# Patient Record
Sex: Male | Born: 1935 | Race: White | Marital: Married | Smoking: Former smoker
Health system: Southern US, Community
[De-identification: ages and names within clinical notes are randomized; demographics above are authoritative.]

## PROBLEM LIST (undated history)

## (undated) DIAGNOSIS — I714 Abdominal aortic aneurysm, without rupture, unspecified: Secondary | ICD-10-CM

---

## 2015-06-05 ENCOUNTER — Other Ambulatory Visit: Payer: Self-pay | Admitting: Geriatric Medicine

## 2015-06-05 DIAGNOSIS — R1900 Intra-abdominal and pelvic swelling, mass and lump, unspecified site: Secondary | ICD-10-CM

## 2015-06-20 ENCOUNTER — Ambulatory Visit
Admission: RE | Admit: 2015-06-20 | Discharge: 2015-06-20 | Disposition: A | Discharge status: Home / Self Care | Payer: Medicare Other | Source: Ambulatory Visit | Attending: Geriatric Medicine | Admitting: Geriatric Medicine

## 2015-06-20 DIAGNOSIS — R1900 Intra-abdominal and pelvic swelling, mass and lump, unspecified site: Secondary | ICD-10-CM

## 2015-06-20 MED ORDER — IOPAMIDOL (ISOVUE-300) INJECTION 61%
100.0000 mL | Freq: Once | INTRAVENOUS | Status: AC | PRN
  Administered 2015-06-20: 100 mL via INTRAVENOUS

## 2016-03-04 ENCOUNTER — Emergency Department: Payer: Medicare Other

## 2016-03-04 ENCOUNTER — Inpatient Hospital Stay
Admission: EM | Admit: 2016-03-04 | Discharge: 2016-03-06 | DRG: 871 | Disposition: A | Discharge status: Hospice | Payer: Medicare Other | Attending: Internal Medicine | Admitting: Internal Medicine

## 2016-03-04 DIAGNOSIS — Z87891 Personal history of nicotine dependence: Secondary | ICD-10-CM

## 2016-03-04 DIAGNOSIS — I959 Hypotension, unspecified: Secondary | ICD-10-CM | POA: Diagnosis present

## 2016-03-04 DIAGNOSIS — A419 Sepsis, unspecified organism: Principal | ICD-10-CM | POA: Diagnosis present

## 2016-03-04 DIAGNOSIS — F0390 Unspecified dementia without behavioral disturbance: Secondary | ICD-10-CM | POA: Diagnosis present

## 2016-03-04 DIAGNOSIS — R627 Adult failure to thrive: Secondary | ICD-10-CM | POA: Diagnosis present

## 2016-03-04 DIAGNOSIS — R778 Other specified abnormalities of plasma proteins: Secondary | ICD-10-CM | POA: Diagnosis present

## 2016-03-04 DIAGNOSIS — K668 Other specified disorders of peritoneum: Secondary | ICD-10-CM

## 2016-03-04 DIAGNOSIS — N179 Acute kidney failure, unspecified: Secondary | ICD-10-CM | POA: Diagnosis present

## 2016-03-04 DIAGNOSIS — G9340 Encephalopathy, unspecified: Secondary | ICD-10-CM | POA: Diagnosis present

## 2016-03-04 DIAGNOSIS — N390 Urinary tract infection, site not specified: Secondary | ICD-10-CM | POA: Diagnosis present

## 2016-03-04 DIAGNOSIS — Z789 Other specified health status: Secondary | ICD-10-CM | POA: Diagnosis not present

## 2016-03-04 DIAGNOSIS — K631 Perforation of intestine (nontraumatic): Secondary | ICD-10-CM | POA: Diagnosis present

## 2016-03-04 DIAGNOSIS — R1084 Generalized abdominal pain: Secondary | ICD-10-CM | POA: Diagnosis not present

## 2016-03-04 DIAGNOSIS — Z66 Do not resuscitate: Secondary | ICD-10-CM

## 2016-03-04 DIAGNOSIS — R6521 Severe sepsis with septic shock: Secondary | ICD-10-CM

## 2016-03-04 DIAGNOSIS — E43 Unspecified severe protein-calorie malnutrition: Secondary | ICD-10-CM | POA: Diagnosis present

## 2016-03-04 DIAGNOSIS — I714 Abdominal aortic aneurysm, without rupture: Secondary | ICD-10-CM | POA: Diagnosis present

## 2016-03-04 DIAGNOSIS — Z515 Encounter for palliative care: Secondary | ICD-10-CM | POA: Diagnosis present

## 2016-03-04 HISTORY — DX: Abdominal aortic aneurysm, without rupture: I71.4

## 2016-03-04 HISTORY — DX: Abdominal aortic aneurysm, without rupture, unspecified: I71.40

## 2016-03-04 LAB — COMPREHENSIVE METABOLIC PANEL
ALT: 32 U/L (ref 17–63)
ANION GAP: 9 (ref 5–15)
AST: 94 U/L — AB (ref 15–41)
Albumin: 2.5 g/dL — ABNORMAL LOW (ref 3.5–5.0)
Alkaline Phosphatase: 79 U/L (ref 38–126)
BUN: 47 mg/dL — ABNORMAL HIGH (ref 6–20)
CALCIUM: 9.2 mg/dL (ref 8.9–10.3)
CHLORIDE: 100 mmol/L — AB (ref 101–111)
CO2: 26 mmol/L (ref 22–32)
Creatinine, Ser: 2.15 mg/dL — ABNORMAL HIGH (ref 0.61–1.24)
GFR, EST AFRICAN AMERICAN: 30 mL/min — AB (ref >60)
GFR, EST NON AFRICAN AMERICAN: 26 mL/min — AB (ref >60)
Glucose, Bld: 104 mg/dL — ABNORMAL HIGH (ref 65–99)
POTASSIUM: 4.4 mmol/L (ref 3.5–5.1)
Sodium: 135 mmol/L (ref 135–145)
TOTAL PROTEIN: 6.7 g/dL (ref 6.5–8.1)
Total Bilirubin: 0.7 mg/dL (ref 0.3–1.2)

## 2016-03-04 LAB — CBC WITH DIFFERENTIAL/PLATELET
BASOS ABS: 0 10*3/uL (ref 0–0.1)
BASOS PCT: 0 %
EOS ABS: 0 10*3/uL (ref 0–0.7)
EOS PCT: 0 %
HEMATOCRIT: 34.3 % — AB (ref 40.0–52.0)
Hemoglobin: 11.6 g/dL — ABNORMAL LOW (ref 13.0–18.0)
Lymphocytes Relative: 6 %
Lymphs Abs: 1.1 10*3/uL (ref 1.0–3.6)
MCH: 27.9 pg (ref 26.0–34.0)
MCHC: 33.7 g/dL (ref 32.0–36.0)
MCV: 82.8 fL (ref 80.0–100.0)
MONO ABS: 0.2 10*3/uL (ref 0.2–1.0)
MONOS PCT: 1 %
Neutro Abs: 15.5 10*3/uL — ABNORMAL HIGH (ref 1.4–6.5)
Neutrophils Relative %: 93 %
PLATELETS: 426 10*3/uL (ref 150–440)
RBC: 4.15 MIL/uL — ABNORMAL LOW (ref 4.40–5.90)
RDW: 14.5 % (ref 11.5–14.5)
WBC: 16.8 10*3/uL — ABNORMAL HIGH (ref 3.8–10.6)

## 2016-03-04 LAB — URINALYSIS COMPLETE WITH MICROSCOPIC (ARMC ONLY)
Bilirubin Urine: NEGATIVE
Glucose, UA: NEGATIVE mg/dL
Nitrite: NEGATIVE
PH: 7 (ref 5.0–8.0)
SPECIFIC GRAVITY, URINE: 1.013 (ref 1.005–1.030)

## 2016-03-04 LAB — TROPONIN I: TROPONIN I: 0.08 ng/mL — AB (ref <0.03)

## 2016-03-04 LAB — LACTIC ACID, PLASMA: LACTIC ACID, VENOUS: 1.6 mmol/L (ref 0.5–1.9)

## 2016-03-04 MED ORDER — SODIUM CHLORIDE 0.9 % IV BOLUS (SEPSIS)
500.0000 mL | Freq: Once | INTRAVENOUS | Status: AC
  Administered 2016-03-04: 500 mL via INTRAVENOUS

## 2016-03-04 MED ORDER — PIPERACILLIN-TAZOBACTAM 3.375 G IVPB 30 MIN
3.3750 g | Freq: Once | INTRAVENOUS | Status: AC
  Administered 2016-03-04: 3.375 g via INTRAVENOUS

## 2016-03-04 MED ORDER — ACETAMINOPHEN 650 MG RE SUPP: 650.0000 mg | Freq: Four times a day (QID) | RECTAL | Status: DC | PRN

## 2016-03-04 MED ORDER — NOREPINEPHRINE BITARTRATE 1 MG/ML IV SOLN
0.0000 ug/min | Freq: Once | INTRAVENOUS | Status: DC
  Administered 2016-03-04: 5 ug/min via INTRAVENOUS

## 2016-03-04 MED ORDER — SODIUM CHLORIDE 0.9 % IV BOLUS (SEPSIS)
1000.0000 mL | Freq: Once | INTRAVENOUS | Status: AC
  Administered 2016-03-04: 1000 mL via INTRAVENOUS

## 2016-03-04 MED ORDER — NOREPINEPHRINE 4 MG/250ML-% IV SOLN
0.0000 ug/min | INTRAVENOUS | Status: DC
  Administered 2016-03-04: 8 ug/min via INTRAVENOUS

## 2016-03-04 MED ORDER — VANCOMYCIN HCL IN DEXTROSE 1-5 GM/200ML-% IV SOLN
1000.0000 mg | Freq: Once | INTRAVENOUS | Status: AC
  Administered 2016-03-04: 1000 mg via INTRAVENOUS

## 2016-03-04 MED ORDER — ACETAMINOPHEN 325 MG PO TABS: 650.0000 mg | ORAL_TABLET | Freq: Four times a day (QID) | ORAL | Status: DC | PRN

## 2016-03-04 MED ORDER — MORPHINE SULFATE (PF) 2 MG/ML IV SOLN
1.0000 mg | INTRAVENOUS | Status: DC | PRN
  Administered 2016-03-05: 1 mg via INTRAVENOUS
  Filled 2016-03-04 (×2): qty 1

## 2016-03-04 MED ORDER — LORAZEPAM 2 MG/ML IJ SOLN
0.5000 mg | Freq: Four times a day (QID) | INTRAMUSCULAR | Status: DC | PRN
  Administered 2016-03-06: 10:00:00 0.5 mg via INTRAVENOUS
  Filled 2016-03-04: qty 1

## 2016-03-04 NOTE — ED Notes (Signed)
CODE SEPSIS CALLED TO DOUG AT [facility] 

## 2016-03-04 NOTE — ED Provider Notes (Addendum)
[facility] Emergency Department Provider Note  ____________________________________________  Time seen: Approximately 1:45 PM  I have reviewed the triage vital signs and the nursing notes.   HISTORY  Chief Complaint Failure To Thrive  Level 5 caveat:  Portions of the history and physical were unable to be obtained due to AMS   HPI Travis Jordan is a 82 y.o. male with h/o AAA and dementia who presents for evaluation of AMS. History is gathered mostly from patient's daughter. She reports the patient has been diagnosed with failure to thrive for over a year due to his dementia. However for the last 3 days patient has been sleeping mostly. Not eating or drinking, last contracted. No fever, no nausea, no vomiting, no trauma, no diarrhea. Patient is unable to provide any history at this time. He arouses to sternal rub and open his eyes however does not answer any questions.  No past medical history on file.  There are no active problems to display for this patient.   No past surgical history on file.  Prior to Admission medications   Not on File    Allergies Review of patient's allergies indicates not on file.  No family history on file.  Social History Social History  Substance Use Topics  . Smoking status: Not on file  . Smokeless tobacco: Not on file  . Alcohol use Not on file    Review of Systems Unable to obtain ____________________________________________   PHYSICAL EXAM:  VITAL SIGNS: ED Triage Vitals  Enc Vitals Group     BP 03/04/16 1339 (!) 80/63     Pulse Rate 03/04/16 1339 100     Resp 03/04/16 1339 (!) 24     Temp 03/04/16 1339 98 F (36.7 C)     Temp Source 03/04/16 1339 Oral     SpO2 03/04/16 1339 92 %     Weight 03/04/16 1341 110 lb (49.9 kg)     Height 03/04/16 1341 5\' 11"  (1.803 m)     Head Circumference --      Peak Flow --      Pain Score --      Pain Loc --      Pain Edu? --      Excl. in GC? --      Constitutional: Arouses to sternum rub, disoriented, no distress. HEENT:      Head: Normocephalic and atraumatic.         Eyes: Conjunctivae are normal. Sclera is non-icteric. EOMI. PERRL      Mouth/Throat: Mucous membranes are dry.       Neck: Supple with no signs of meningismus. Cardiovascular: Tachycardic with regular rhythm. No murmurs, gallops, or rubs. 2+ symmetrical distal pulses are present in all extremities. No JVD. Respiratory: Hypoxic to low 90s on RA, distant lungs sounds. Tachypneic to the mid 20s Gastrointestinal: Soft, diffusely tender to palpation, non distended with positive bowel sounds. No rebound or guarding. Genitourinary: No CVA tenderness. Musculoskeletal: Nontender with normal range of motion in all extremities. No edema, cyanosis, or erythema of extremities. Neurologic: Face is symmetric, moves all 4 extremities, disoriented, arouses to voice and opens eyes.  Skin: Skin is warm, dry and intact. No rash noted.   ____________________________________________   LABS (all labs ordered are listed, but only abnormal results are displayed)  Labs Reviewed  URINALYSIS COMPLETEWITH MICROSCOPIC ([facility] ONLY) - Abnormal; Notable for the following:       Result Value   Color, Urine RED (*)  APPearance TURBID (*)    Ketones, ur TRACE (*)    Hgb urine dipstick 1+ (*)    Protein, ur >500 (*)    Leukocytes, UA 2+ (*)    Bacteria, UA MANY (*)    Squamous Epithelial / LPF 0-5 (*)    All other components within normal limits  COMPREHENSIVE METABOLIC PANEL - Abnormal; Notable for the following:    Chloride 100 (*)    Glucose, Bld 104 (*)    BUN 47 (*)    Creatinine, Ser 2.15 (*)    Albumin 2.5 (*)    AST 94 (*)    GFR calc non Af Amer 26 (*)    GFR calc Af Amer 30 (*)    All other components within normal limits  CBC WITH DIFFERENTIAL/PLATELET - Abnormal; Notable for the following:    WBC 16.8 (*)    RBC 4.15 (*)    Hemoglobin 11.6 (*)    HCT 34.3 (*)     Neutro Abs 15.5 (*)    All other components within normal limits  TROPONIN I - Abnormal; Notable for the following:    Troponin I 0.08 (*)    All other components within normal limits  URINE CULTURE  CULTURE, BLOOD (ROUTINE X 2)  CULTURE, BLOOD (ROUTINE X 2)  LACTIC ACID, PLASMA  LACTIC ACID, PLASMA  CBG MONITORING, ED   ____________________________________________  EKG  ED ECG REPORT I, Nita Sicklearolina Latonyia Lopata, the attending physician, personally viewed and interpreted this ECG.  Normal sinus rhythm, rate of 82, normal intervals, normal axis, no ST elevations or depressions. ____________________________________________  RADIOLOGY  CXR:  Negative  KUB: Pneumoperitoneum.  RIGHT lung base atelectasis.  Moderate atherosclerosis. ____________________________________________   PROCEDURES  Procedure(s) performed:yes .Central Line Date/Time: 03/04/2016 3:39 PM Performed by: Nita SickleVERONESE, [employer] Authorized by: Nita SickleVERONESE, [place]   Consent:    Consent obtained:  Written   Consent given by:  Spouse and guardian   Risks discussed:  Arterial puncture, incorrect placement, bleeding, infection and nerve damage   Alternatives discussed:  No treatment Pre-procedure details:    Hand hygiene: Hand hygiene performed prior to insertion     Sterile barrier technique: All elements of maximal sterile technique followed     Skin preparation:  2% chlorhexidine   Skin preparation agent: Skin preparation agent completely dried prior to procedure   Anesthesia (see MAR for exact dosages):    Anesthesia method:  Local infiltration   Local anesthetic:  Lidocaine 1% w/o epi Procedure details:    Location:  L internal jugular   Patient position:  Trendelenburg   Procedural supplies:  Triple lumen   Landmarks identified: yes     Ultrasound guidance: yes     Sterile ultrasound techniques: Sterile gel and sterile probe covers were used     Number of attempts:  1   Successful placement: yes    Post-procedure details:    Post-procedure:  Dressing applied and line sutured   Assessment:  Blood return through all ports, no pneumothorax on x-ray, free fluid flow and placement verified by x-ray   Patient tolerance of procedure:  Tolerated well, no immediate complications      Critical Care performed: CRITICAL CARE Performed by: Nita Sicklearolina Danaja Lasota  ?  Total critical care time: 60 min  Critical care time was exclusive of separately billable procedures and treating other patients.  Critical care was necessary to treat or prevent imminent or life-threatening deterioration.  Critical care was time spent personally by me on the following  activities: development of treatment plan with patient and/or surrogate as well as nursing, discussions with consultants, evaluation of patient's response to treatment, examination of patient, obtaining history from patient or surrogate, ordering and performing treatments and interventions, ordering and review of laboratory studies, ordering and review of radiographic studies, pulse oximetry and re-evaluation of patient's condition.  ____________________________________________   INITIAL IMPRESSION / ASSESSMENT AND PLAN / ED COURSE  82 year old male DNR/DNI who presents with 3 days of fatigue, sleeping mostly, decreased oral intake. Patient unable to provide any history. Patient arrives with heart rate of 100, afebrile, blood pressure 80/63, patient with diffuse tenderness to palpation on his abdomen. Bedside fast showing aortic aneurysm measuring 4 cm with no intra-abdominal fluid. According to the family patient does have a history of a AAA that he is followed 4. Patient received 2-1/2 L of fluid and remained hypotensive. He was started on norepi and a left IJ was placed for vasopressor and resuscitation. Patient was given Zosyn and vancomycin for sepsis concern for intra-abdominal source. His urine was extremely dark and brown looking like stool which  could also be a source of infection.  Clinical Course  Comment By Time  CT pending. Patient on norepi via [facility]. Plan to admit to surgery vs medicine pending results of CT/ Care transferred to Dr. York CeriseForbach. Nita Sicklearolina Clayburn Weekly, MD 08/23 1622   _________________________ 4:38 PM on 03/04/2016 -----------------------------------------  Spoke with Dr. Everlene FarrierPabon who will talk to the family about surgery vs medical management. If surgery is decided patient will be admitted to surgery services otherwise to ICU. Daughter and wife confirmed that patient is DNR/ DNI.  Pertinent labs & imaging results that were available during my care of the patient were reviewed by me and considered in my medical decision making (see chart for details).    ____________________________________________   FINAL CLINICAL IMPRESSION(S) / ED DIAGNOSES  Final diagnoses:  Septic shock (HCC)  Pneumoperitoneum      NEW MEDICATIONS STARTED DURING THIS VISIT:  New Prescriptions   No medications on file     Note:  This document was prepared using Dragon voice recognition software and may include unintentional dictation errors.      Nita Sicklearolina Kerigan Narvaez, MD 03/05/16 [phone]

## 2016-03-04 NOTE — ED Triage Notes (Signed)
Pt arrived to ED from home after family reports pat has not been at baseline for the past 6 days. Per EMS pt has had decreased PO intake up until 2 days ago when pt stopped eating and drinking completely. Family reports pt has had decreased activity and mobility levels. 6 days ago MS reports pt was alert and oriented. Pt confused x 4 at this time. BP 80/63 upon arrival. Pt reporting to voice at this time and denies pain.

## 2016-03-04 NOTE — ED Provider Notes (Signed)
-----------------------------------------   5:04 PM on 03/04/2016 -----------------------------------------   Blood pressure (!) 82/52, pulse 81, temperature 98 F (36.7 C), temperature source Oral, resp. rate 16, height 5\' 11"  (1.803 m), weight 49.9 kg, SpO2 98 %.  Assuming care from Dr. Don PerkingVeronese.  In short, Jacelyn Piiles Cowie is a 82 y.o. male with a chief complaint of Failure To Thrive .  Refer to the original H&P for additional details.  The current plan of care is to check in with the family about 20 minutes and see what they want to do.   Dr. Everlene FarrierPabon had an extensive conversation with the patient's family and explained that he is not a surgical candidate and that he is actively dying.  The options at this point include admission for comfort care admission to the ICU for aggressive antibiotics and pressors although this will ultimately be unsuccessful, or discharge home to die at home, but this is unlikely to be the choice given other complications at home.  ----------------------------------------- 5:28 PM on 03/04/2016 -----------------------------------------  I have had 2 separate extensive conversations with the patient's family.  They understand his prognosis and made the decision to discontinue pressors and admit the patient to the hospital for comfort care.  They understand that he may pass away very quickly after the pressors are turned off or that he may survive for an extended period of time.  I verified with all adult family members present that this is the patient's wishes and they are comfortable with that plan given that he had been very clear about the desire to not artificially prolong his life.  The patient is currently unresponsive with a mean arterial pressure of approximately 66 on levothyroid.  I discussed the case with Dr. Allena KatzPatel with the hospitalist service who will admit for palliative care   Loleta Roseory Joelene Barriere, MD 03/04/16 1729

## 2016-03-04 NOTE — Progress Notes (Signed)
Pharmacy Antibiotic Note  Travis Jordan is a 82 y.o. male admitted on 03/04/2016 with sepsis.  Pharmacy has been consulted for vancomycin dosing.  Plan: Ordered Vancomycin 1000mg  IV x1. Patient transitioned to comfort care, no antibiotics to be given.    Height: 5\' 11"  (180.3 cm) Weight: 110 lb (49.9 kg) IBW/kg (Calculated) : 75.3  Temp (24hrs), Avg:98 F (36.7 C), Min:98 F (36.7 C), Max:98 F (36.7 C)   Recent Labs Lab 03/04/16 1345  WBC 16.8*  CREATININE 2.15*    Estimated Creatinine Clearance: 16.8 mL/min (by C-G formula based on SCr of 2.15 mg/dL).    Allergies not on file  Antimicrobials this admission: Vancomycin 8/23 >> 8/23 Zosyn 8/23 >> 8/23   Microbiology results: 8/23 BCx: in process 8/23 UCx: in process  Thank you for allowing pharmacy to be a part of this patient's care.  Travis Jordan 03/04/2016 2:38 PM

## 2016-03-04 NOTE — H&P (Signed)
[facility] [place] [facility]   PATIENT NAME: Travis Jordan    MR#:  [id number]  DATE OF BIRTH:  11/14/1925  DATE OF ADMISSION:  03/04/2016  PRIMARY CARE PHYSICIAN: Pcp Not In System   REQUESTING/REFERRING PHYSICIAN: Dr York Cerise  Acute encephalopathy poor appetite for last few days not doing well at home.  HISTORY OF PRESENT ILLNESS:  Naomi Talkington  is a 82 y.o. male with a known history of AAA, dementia comes to the emergency room brought in by EMS. Patient history is obtained from daughter and wife. Patient is currently unresponsive and unable to give any history of review of system. Per daughter patient has been declining over the past year with severe protein calorie malnutrition and failure to thrive. For last couple days he's not been eating and sleeping most of the day and decrease in his usual activity of daily routine. Today he was found unresponsive brought to the emergency room. His vomiting septic shock and evaluation noted patient has pneumoperitoneum secondary to possible  perforated bowel. He was seen by surgery and given his comorbidities and current medical condition patient is not a candidate for any surgical option at present this was presented to the family who decided and requested comfort measures. Internal medicine was consulted for  admitting patient for comfort measures  PAST MEDICAL HISTORY:  Dementia AAA  PAST SURGICAL HISTOIRY:  No past surgical history on file.  SOCIAL HISTORY:   Social History  Substance Use Topics  . Smoking status: Not on file  . Smokeless tobacco: Not on file  . Alcohol use Not on file    FAMILY HISTORY:  Unable to obtain-pt unresponsive  DRUG ALLERGIES:  Not on File  REVIEW OF SYSTEMS:  Review of Systems  Unable to perform ROS: Patient unresponsive  Constitutional: Negative for chills, fever and weight loss.  HENT: Negative for ear discharge, ear pain and nosebleeds.   Eyes: Negative for  blurred vision, pain and discharge.  Respiratory: Negative for sputum production, shortness of breath, wheezing and stridor.   Cardiovascular: Negative for chest pain, palpitations, orthopnea and PND.  Gastrointestinal: Negative for abdominal pain, diarrhea, nausea and vomiting.  Genitourinary: Negative for frequency and urgency.  Musculoskeletal: Negative for back pain and joint pain.  Neurological: Negative for sensory change, speech change, focal weakness and weakness.  Psychiatric/Behavioral: Negative for depression and hallucinations. The patient is not nervous/anxious.      MEDICATIONS AT HOME:   Prior to Admission medications   Not on File      VITAL SIGNS:  Blood pressure (!) 82/64, pulse 84, temperature 98 F (36.7 C), temperature source Oral, resp. rate 18, height 5\' 11"  (1.803 m), weight 110 lb (49.9 kg), SpO2 98 %.  PHYSICAL EXAMINATION:  GENERAL:  82 y.o.-year-old patient lying in the bed with no acute distress. Critically ill, severely malnourished thin and cachectic  EYES: Pupils equal, round, reactive to light and accommodation. No scleral icterus. Extraocular muscles intact.  HEENT: Head atraumatic, normocephalic.  oral mucosa drySupple, no jugular venous distention. No thyroid enlargement, no tenderness.  LUNGS: Normal breath sounds bilaterally, no wheezing, rales,rhonchi or crepitation. No use of accessory muscles of respiration. patient hypoventilating technical tachycardia  CARDIOVASCULAR: S1, S2 normal. No murmurs, rubs, or gallops.  ABDOMEabdominous distended no bowel sounds heard. Extremities : No pedal edema, cyanosis, or clubbing.  NEUROLOGIC: Unable to assess patient obtunded unresponsive  PSYCHIATRIC:  unresponsiveSKIN: No obvious rash, lesion, or ulcer.   LABORATORY PANEL:   CBC  Recent Labs Lab 03/04/16 1345  WBC 16.8*  HGB 11.6*  HCT 34.3*  PLT 426    ------------------------------------------------------------------------------------------------------------------  Chemistries   Recent Labs Lab 03/04/16 1345  NA 135  K 4.4  CL 100*  CO2 26  GLUCOSE 104*  BUN 47*  CREATININE 2.15*  CALCIUM 9.2  AST 94*  ALT 32  ALKPHOS 79  BILITOT 0.7   ------------------------------------------------------------------------------------------------------------------  Cardiac Enzymes  Recent Labs Lab 03/04/16 1345  TROPONINI 0.08*   ------------------------------------------------------------------------------------------------------------------  RADIOLOGY:  Ct Abdomen Pelvis Wo Contrast  Result Date: 03/04/2016 CLINICAL DATA:  Decreased PO intake past couple days with decreased activity. Increased confusion. EXAM: CT ABDOMEN AND PELVIS WITHOUT CONTRAST TECHNIQUE: Multidetector CT imaging of the abdomen and pelvis was performed following the standard protocol without IV contrast. COMPARISON:  CT 06/20/2015 and plain films 03/04/2016 FINDINGS: Lower chest: There is bibasilar opacification likely atelectasis although cannot exclude infection. Hepatobiliary: Possible gallbladder sludge. No evidence of liver mass or ductal dilatation. Pancreas: No mass or inflammatory process identified on this un-enhanced exam. Spleen: Within normal limits in size. Adrenals/Urinary Tract: Adrenal glands are within normal. Kidneys are normal in size without hydronephrosis or nephrolithiasis. There are 2 small right renal cyst. Ureters are within normal. There is a moderate amount of air within the bladder as well as mild air within the bladder wall. Findings may be due to recent instrumentation versus enterovesical fistula or infection. Bladder diverticula adjacent the anterior dome of the bladder containing air-fluid level. Second diverticula along the right posterior lateral aspect of the bladder containing mild hyperdense dependent debris. Stomach/Bowel: There  is a moderate amount of free peritoneal air present. Stomach appears within normal. There are a few air and fluid-filled mildly dilated small bowel loops in the left upper quadrant. Right colon is somewhat difficult to define. Terminal ileum appear to be within normal. Appendix is not visualized. Air and stool present throughout the colon. There are several mottled areas of lucency likely air in stool over the region of the left pericolic gutter and pelvis likely extraluminal. There is a 2.4 cm air/stool collection abutting the posterior wall of the bladder and adjacent rectosigmoid colon which may be extraluminal versus prominent diverticula. Possible focal wall thickening/ mass along the wall of the rectosigmoid junction in the midline pelvis. Origin of the free peritoneal air is not clear although likely from rectosigmoid colon. Vascular/Lymphatic: No pathologically enlarged lymph nodes. Moderate calcified plaque over the abdominal aorta. There is moderate dilatation of the infrarenal abdominal aorta measuring 4.5 cm in AP diameter (previously 4.4 cm). Reproductive: Prostate is within normal. Other: None. Musculoskeletal: There are moderate degenerate changes of the spine with multilevel disc disease over the lumbar spine. Degenerative change of the hips. IMPRESSION: Moderate pneumoperitoneum. Origin of this free air is not clear, although likely involves the region of the rectosigmoid colon as there is focal wall thickening/ mass along the wall of the rectosigmoid colon as well as possible extraluminal stool adjacent the sigmoid colon. No significant adjacent adenopathy. Mild dilatation of a few small bowel loops in the left upper quadrant. Air within the bladder and bladder wall with 2 bladder diverticula identified. Findings may be due to recent instrumentation versus enterovesical fistula or infection. Mild bibasilar opacification likely atelectasis although cannot exclude pneumonia. 4.5 cm infrarenal  abdominal aortic aneurysm (previously 4.4 cm). Recommend followup by ultrasound in 1 year. This recommendation follows ACR consensus guidelines: White Paper of the ACR Incidental Findings Committee II on Vascular Findings. J Am Coll Radiol  2013; 16:109-60410:789-794. Right renal cyst. Possible gallbladder sludge. Critical Value/emergent results were called by telephone at the time of interpretation on 03/04/2016 at 4:48 pm to Dr. York CeriseForbach , who verbally acknowledged these results. Electronically Signed   By: Elberta Fortisaniel  Boyle M.D.   On: 03/04/2016 16:48   Dg Abdomen 1 View  Result Date: 03/04/2016 CLINICAL DATA:  Anorexia for 2 days. Altered mental status. History of abdominal aortic aneurysm and diverticulosis. EXAM: PORTABLE CHEST 1 VIEW COMPARISON:  CT abdomen and pelvis June 20, 2015 FINDINGS: Cardiomediastinal silhouette is normal. Mild calcific atherosclerosis of the aortic arch. Mild bibasilar strandy densities. Lungs are otherwise clear, no pleural effusions. No pneumothorax. Air density below the bilateral diaphragms. Bowel gas pattern is nondilated and nonobstructive. No intra-abdominal mass effect, pathologic calcifications or free air. Soft tissue planes and included osseous structures are non-suspicious. Moderate calcifications better characterized on prior CT. IMPRESSION: Pneumoperitoneum. RIGHT lung base atelectasis. Moderate atherosclerosis. Acute findings discussed with and reconfirmed by Festus Aloer.[place] VERONESE on 03/04/2016 at 2:24 pm. Electronically Signed   By: Awilda Metroourtnay  Bloomer M.D.   On: 03/04/2016 14:24   Dg Chest Portable 1 View  Result Date: 03/04/2016 CLINICAL DATA:  Central line placement. EXAM: PORTABLE CHEST 1 VIEW COMPARISON:  Radiograph of March 04, 2016. FINDINGS: Stable cardiomediastinal silhouette. No pneumothorax is noted. Mild bibasilar subsegmental atelectasis is noted. Interval placement of left internal jugular catheter with distal tip in expected position of SVC. Degenerative changes  seen involving the left glenohumeral joint. IMPRESSION: Interval placement of left internal jugular catheter with distal tip in expected position of the SVC. No pneumothorax is noted. Mild bibasilar subsegmental atelectasis is noted. Electronically Signed   By: Lupita RaiderJames  Green Jr, M.D.   On: 03/04/2016 15:52   Dg Chest Portable 1 View  Result Date: 03/04/2016 CLINICAL DATA:  Anorexia for 2 days. Altered mental status. History of abdominal aortic aneurysm and diverticulosis. EXAM: PORTABLE CHEST 1 VIEW COMPARISON:  CT abdomen and pelvis June 20, 2015 FINDINGS: Cardiomediastinal silhouette is normal. Mild calcific atherosclerosis of the aortic arch. Mild bibasilar strandy densities. Lungs are otherwise clear, no pleural effusions. No pneumothorax. Air density below the bilateral diaphragms. Bowel gas pattern is nondilated and nonobstructive. No intra-abdominal mass effect, pathologic calcifications or free air. Soft tissue planes and included osseous structures are non-suspicious. Moderate calcifications better characterized on prior CT. IMPRESSION: Pneumoperitoneum. RIGHT lung base atelectasis. Moderate atherosclerosis. Acute findings discussed with and reconfirmed by Festus Aloer.[place] VERONESE on 03/04/2016 at 2:24 pm. Electronically Signed   By: Awilda Metroourtnay  Bloomer M.D.   On: 03/04/2016 14:24    EKG:    IMPRESSION AND PLAN:   Jacelyn Piiles Fortson  is a 82 y.o. male with a known history of AAA, dementia comes to the emergency room brought in by EMS. Patient history is obtained from daughter and wife. Patient is currently unresponsive and unable to give any history of review of system. Per daughter patient has been declining over the past year with severe protein calorie malnutrition and failure to thrive. For last couple days he's not been eating and sleeping most of the day and decrease in his usual activity of daily routine. Today he was found unresponsive brought to the emergency room. His vomiting septic shock  and evaluation noted patient has pneumoperitoneum secondary to possible perfect but perforated bowel.    1. Septic shock secondary to pneumoperitoneum due to perforated bowel -Lengthy discussion was made by surgery in the emergency room by family members. Given critical illness patient's comorbidity and severe protein  calorie malnutrition patient is not a candidate for for surgery and hence patient now is comfort care -IV morphine when necessary -IV Ativan when necessary  2. Perforated bowel -Patient is on comfort measures  3. DVT prophylaxis none Patient is comfort care  Palliative care consultation placed  Spoke with wife and patient's daughter and questions answered. They understand critical nature of illness and that patient will not survive this hospitalization    All the records are reviewed and case discussed with ED provider. Management plans discussed with the patient, family and they are in agreement.  CODE STATUS: full  TOTAL TIME TAKING CARE OF THIS PATIENT50 minutes.    Qunisha Bryk M.D on 03/04/2016 at 5:47 PM  Between 7am to 6pm - Pager - [phone]  After 6pm go to [email or web address] - password [email or web address]  [facility]io [facility]  Office  [phone]  CC: Primary care physician; Pcp Not In System

## 2016-03-04 NOTE — Progress Notes (Addendum)
Patient ID: Travis Jordan, male   DOB: 06/01/1926, 82 y.o.   MRN: [id number]  HPI Travis Jordan is a 82 y.o. male asked to see in consultation by Dr. Don PerkingVeronese for abdominal catastrophe. Apparently 3 days ago he was complaining of some abdominal pain and over the last day he has become unresponsive. The history is taken from both the daughter and his wife. They report that over the last year he has had decline in his overall mentation and performance status. He has said multiple times that he was ready to die. He was found unresponsive and EMS was called. An aggressive resuscitated measurements where initiated in the form of crystalloids and vasopressors. I have independently reviewed his CT scan of the abdomen showing evidence of coronary disease, a triple A there is no rupture and evidence of free air with sigmoid inflammation and potentially a mass. Likely perforated diverticulitis versus a perforated cancer. Also has an acute kidney injury and elevated white count as well as elevated troponins  HPI  PMHX Dementia AAA No past surgical history on file.  No family history on file.  Social HistoryNo tobacco or etoh  Aller: NKDA  Current Facility-Administered Medications  Medication Dose Route Frequency Provider Last Rate Last Dose  . norepinephrine (LEVOPHED) 4mg  in D5W 250mL premix infusion  0-40 mcg/min Intravenous Titrated Nita Sicklearolina Veronese, MD 30 mL/hr at 03/04/16 1638 8 mcg/min at 03/04/16 1638   No current outpatient prescriptions on file.     Review of Systems A 10 point review of systems was Unable to be obtained because the patient is unresponsive.  Physical Exam Blood pressure (!) 86/63, pulse 78, temperature 98 F (36.7 C), temperature source Oral, resp. rate 16, height 5\' 11"  (1.803 m), weight 49.9 kg (110 lb), SpO2 97 %. CONSTITUTIONAL: Moribund elderly male. Malnourished Oropharynx: dry mucosa, no lesions Neck: no masses, supple, No jVD CARDIOVASCULAR: Heart is  regular without murmurs, gallops, or rubs. CHEST: bilateral rales w moribund respiration GI: The abdomen is  Soft, some tenderness, difficult exam secondary to his altered MS. No masses. Ext: cool to touch and decrease capillary refill Neuro: he is not focal , moribund, only responsive to sternal rub and pain.    Data Reviewed  I have personally reviewed the patient's imaging, laboratory findings and medical records.    Assessment/Plan 82 year old male with a history of dementia and failure to thrive admitted with perforated viscus and septic shock  likely from either a sigmoid diverticulitis or perforated sigmoid malignancy. He is currently septic and not responsive. . Discussed with the family in detail about his current situation and his operative mortality approaches close to 100%. He is also DO NOT RESUSCITATE and DO NOT INTUBATE I and when asking to his daughter what he would want to have done in this situation, her answer was no major heroic interventions and this includes major surgery. He has a very poor reserve, is severely malnourished and is currently dying from septic shock. At this point the family once to think about further intervention this is to include comfort care versus medical management Versus taking the patient back home (so he can die at home). There will make some phone calls to family members and make a final determination about their final decision. He is obviously not a surgical candidate at this point and I have discussed in detail about my reasoning with the family. They understand and are very appreciative. The ER will make a final determination about his disposition. I have provided extensive  counseling to the family regarding the end of life matters. Again they are very appreciative   Sterling Bigiego Zennie Ayars, MD FACS General Surgeon 03/04/2016, 5:25 PM

## 2016-03-05 DIAGNOSIS — Z515 Encounter for palliative care: Secondary | ICD-10-CM

## 2016-03-05 DIAGNOSIS — K668 Other specified disorders of peritoneum: Secondary | ICD-10-CM

## 2016-03-05 DIAGNOSIS — R1084 Generalized abdominal pain: Secondary | ICD-10-CM

## 2016-03-05 DIAGNOSIS — Z789 Other specified health status: Secondary | ICD-10-CM

## 2016-03-05 DIAGNOSIS — Z66 Do not resuscitate: Secondary | ICD-10-CM

## 2016-03-05 LAB — URINE CULTURE

## 2016-03-05 LAB — C DIFFICILE QUICK SCREEN W PCR REFLEX
C Diff antigen: NEGATIVE
C Diff interpretation: NEGATIVE
C Diff toxin: NEGATIVE

## 2016-03-05 MED ORDER — MORPHINE SULFATE (PF) 2 MG/ML IV SOLN
1.0000 mg | INTRAVENOUS | Status: DC | PRN
  Administered 2016-03-05 – 2016-03-06 (×6): 1 mg via INTRAVENOUS
  Filled 2016-03-05 (×6): qty 1

## 2016-03-05 NOTE — Clinical Social Work Note (Signed)
Clinical Social Work Assessment  Patient Details  Name: Travis Jordan MRN: [id number] Date of Birth: 11-Nov-1925  Date of referral:  03/05/16               Reason for consult:  Discharge Planning                Permission sought to share information with:  Family Supports Permission granted to share information::  Yes, Verbal Permission Granted  Name::        Agency::     Relationship::   (Lynda- Daughter)  Contact Information:     Housing/Transportation Living arrangements for the past 2 months:  Single Family Home Source of Information:  Adult Children Patient Interpreter Needed:  None Criminal Activity/Legal Involvement Pertinent to Current Situation/Hospitalization:  No - Comment as needed Significant Relationships:  Adult Children, Other Family Members, Spouse Lives with:  Spouse Do you feel safe going back to the place where you live?  No Need for family participation in patient care:  Yes (Comment) (Lynda- Daughter)  Care giving concerns:  Patient's family is interested in [facility] Placement. 3   Social Worker assessment / plan:  CSW and CSW Intern met with patient and family at discharge. CSW introduced herself and her role. Per patient's daughter patient lives with his wife. Stated that they are interested in [facility] placement. CSW encouraged family to tour the facility. Granted CSW verbal permission to send referral to [facility].   CSW made referral to [facility]. Per Santiago Glad there will possibly be a bed tomorrow 03/06/16. Stated she will begin paperwork with patient's family today. CSW informed MD of above. CSW will continue to follow and assist.   Employment status:  Retired Insurance underwriter information:  Medicare PT Recommendations:  Not assessed at this time Information / Referral to community resources:  Other (Comment Required) (Residential Hospice )  Patient/Family's Response to care:  Patient's  family in agreement for patient to discharge to [facility].   Patient/Family's Understanding of and Emotional Response to Diagnosis, Current Treatment, and Prognosis:  Patient's family reports they understand Diagnosis, Current Treatment, and Prognosis. Thanked CSW for her assistance.   Emotional Assessment Appearance:  Appears stated age Attitude/Demeanor/Rapport:  Unable to Assess Affect (typically observed):  Unable to Assess Orientation:   (Unable to Assess) Alcohol / Substance use:  Not Applicable Psych involvement (Current and /or in the community):  No (Comment)  Discharge Needs  Concerns to be addressed:  Discharge Planning Concerns Readmission within the last 30 days:  No Current discharge risk:  Chronically ill Barriers to Discharge:  Continued Medical Work up   [employer], [place] 03/05/2016, 4:01 PM

## 2016-03-05 NOTE — Care Management Important Message (Signed)
Important Message  Patient Details  Name: Travis Jordan MRN: [id number] Date of Birth: 08/15/1925   Medicare Important Message Given:  Yes    Gwenette GreetBrenda S Nicklas Mcsweeney, RN 03/05/2016, 8:11 AM

## 2016-03-05 NOTE — Progress Notes (Signed)
Orders to insert foley and pull Central line.  Foley 16 french inserted without complications or resistance, pt tolerated well.  400 ml of urine returned immediately dark tea colored with sediment.  Central Line to left neck pulled per policy, pt tolerated well.  Pressure held dressing applied.  Family at bedside instructed r/t foley and d/c of central line

## 2016-03-05 NOTE — Progress Notes (Signed)
New hospice home referral received from [place]. Writer met with patient's daughter Gerald Leitz and son Gerald Stabs in the patient's room to initiate education regarding hospice services, philosophy and team approach to care with good understanding voiced. Questions answered, consents signed. Plan is for transfer to the Hospice home tomorrow 8/25 pending patient stability for transfer. Family in agreement. Hospital care team made aware. Patient information faxed to hospice referral. Flo Shanks RN, BSN, [facility] [place], [facility] [phone] c

## 2016-03-05 NOTE — Consult Note (Signed)
Consultation Note Date: 03/05/2016   Patient Name: Travis Jordan  DOB: 03/04/1926  MRN: [id number]  Age / Sex: 82 y.o., male  PCP: Pcp Not In System Referring Physician: Altamese DillingVaibhavkumar Vachhani, MD  Reason for Consultation: Establishing goals of care, Non pain symptom management, Pain control and Psychosocial/spiritual support  HPI/Patient Profile: 82 y.o. male admitted on 03/04/2016 witha known history of AAA, mild dementia admitted through the ER   Per daughter patient has been declining over the past year with severe protein calorie malnutrition and failure to thrive. For last couple days he's not been eating and sleeping most of the day and decrease in his usual activity of daily routine. Today he was found unresponsive brought to the emergency room.   Abd CT  On evaluation noted patient has pneumoperitoneum secondary to possible  perforated bowel.  He was seen by surgery and given his comorbidities and current medical condition patient is not a candidate for any surgical option at present this was presented to the family who decided and requested comfort measures.    Clinical Assessment and Goals of Care:  This NP Lorinda CreedMary Shirleen Mcfaul reviewed medical records, received report from team, assessed the patient and then meet at the patient's bedside along with his two daughters and two sons  to discuss diagnosis, prognosis, GOC, EOL wishes disposition and options.  Although all family "know" that a full comfort path is the best thing for this patient they are struggling with their decisions.  A  discussion was had today regarding advanced directives.  Concepts specific to code status, artifical feeding and hydration, continued IV antibiotics and rehospitalization was had.  The difference between a aggressive medical intervention path  and a palliative comfort care path for this patient at this time was had.  Values and  goals of care important to patient and family were attempted to be elicited.  MOST form introduced.  Hard Choices booklet left for review  Concept of Hospice and Palliative Care were discussed  Natural trajectory and expectations at EOL were discussed.  Questions and concerns addressed.   Family encouraged to call with questions or concerns.  PMT will continue to support holistically.    SUMMARY OF RECOMMENDATIONS    Code Status/Advance Care Planning:  DNR    Symptom Management:   Pain/Dyspnea: Morphine 1 mg IV every 1 hr prn   Agitation: Ativan 1 mg IV every 6 hrs prn  Foley for comfort  Ice chips as tolerlated   Palliative Prophylaxis:   Aspiration, Frequent Pain Assessment and Oral Care  Additional Recommendations (Limitations, Scope, Preferences):  Full Comfort Care  Psycho-social/Spiritual:     Additional Recommendations: Education on Hospice  Prognosis:   < 2 weeks  Discharge Planning: Hospice facility      Primary Diagnoses: Present on Admission: . Septic shock (HCC)   I have reviewed the medical record, interviewed the patient and family, and examined the patient. The following aspects are pertinent.  Past Medical History:  Diagnosis Date  . AAA (abdominal aortic aneurysm) (HCC)  Social History   Social History  . Marital status: Married    Spouse name: N/A  . Number of children: N/A  . Years of education: N/A   Social History Main Topics  . Smoking status: Former Games developermoker  . Smokeless tobacco: Never Used  . Alcohol use No  . Drug use: No  . Sexual activity: No   Other Topics Concern  . None   Social History Narrative  . None   History reviewed. No pertinent family history. Scheduled Meds:  Continuous Infusions:  PRN Meds:.acetaminophen **OR** acetaminophen, LORazepam, morphine injection Medications Prior to Admission:  Prior to Admission medications   Not on File   Not on File Review of Systems  Unable to perform ROS:  Acuity of condition    Physical Exam  Constitutional: He appears lethargic. He appears cachectic. He appears ill.  Cardiovascular: Tachycardia present.   Pulmonary/Chest: He has decreased breath sounds in the right lower field and the left lower field.  Abdominal: Bowel sounds are decreased. There is generalized tenderness. There is rigidity and guarding.  Musculoskeletal:  generalized weakness and atrophy  Neurological: He appears lethargic.  Skin: Skin is warm and dry.    Vital Signs: BP (!) 88/56   Pulse 86   Temp 98.4 F (36.9 C) (Oral)   Resp 20   Ht 5\' 11"  (1.803 m)   Wt 49.9 kg (110 lb)   SpO2 90%   BMI 15.34 kg/m  Pain Assessment: No/denies pain       SpO2: SpO2: 90 % O2 Device:SpO2: 90 % O2 Flow Rate: .O2 Flow Rate (L/min): 4 L/min  IO: Intake/output summary:  Intake/Output Summary (Last 24 hours) at 03/05/16 0820 Last data filed at 03/05/16 0400  Gross per 24 hour  Intake                0 ml  Output                0 ml  Net                0 ml    LBM: Last BM Date: 03/05/16 Baseline Weight: Weight: 49.9 kg (110 lb) Most recent weight: Weight: 49.9 kg (110 lb)      Palliative Assessment/Data:  20 %    Discussed with Dr Elisabeth PigeonVachhani  Time In: 0730 Time Out: 0900 Time Total: 75 min Greater than 50%  of this time was spent counseling and coordinating care related to the above assessment and plan.  Signed by: Lorinda CreedLARACH, Travis Amparo, NP   Please contact Palliative Medicine Team phone at [phone] for questions and concerns.  For individual provider: See Loretha StaplerAmion

## 2016-03-05 NOTE — Progress Notes (Signed)
Palliative Medicine consult noted. Due to high referral volume, there may be a delay seeing this patient. Please call the Palliative Medicine Team office at [phone] if recommendations are needed in the interim.  Thank you for inviting us to see this patient.  Haunani Dickard G. Domingo Fuson, RN, BSN, CHPN 03/05/2016 9:03 AM Cell [phone] 8:00-4:00 Monday-Friday Office [phone] 

## 2016-03-06 MED ORDER — MORPHINE SULFATE 20 MG/5ML PO SOLN: 5.0000 mg | ORAL | 0 refills | Status: AC | PRN

## 2016-03-06 NOTE — Progress Notes (Signed)
[employer] [facility] [place] [facility]   PATIENT NAME: Travis Jordan    MR#:  [id number]  DATE OF BIRTH:  04/29/1926  SUBJECTIVE:  CHIEF COMPLAINT:   Chief Complaint  Patient presents with  . Failure To Thrive   Pt was started on comfort care on admission, remains on comfort measures, opens eyes and says few words, hard of hearing, no signs of pain.  REVIEW OF SYSTEMS:  ROS Not able to give, due to his medical condition and mental status. DRUG ALLERGIES:  Not on File  VITALS:  Blood pressure 97/64, pulse (!) 109, temperature 98.4 F (36.9 C), temperature source Oral, resp. rate 20, height 5\' 11"  (1.803 m), weight 49.9 kg (110 lb), SpO2 92 %.  PHYSICAL EXAMINATION:  GENERAL:  82 y.o.-year-old patient lying in the bed with no acute distress.  EYES: Pupils equal, round, reactive to light .  HEENT: Head atraumatic, normocephalic. Oropharynx and nasopharynx clear.  NECK:  Supple, no jugular venous distention. No thyroid enlargement, no tenderness.  LUNGS: Normal breath sounds bilaterally, no wheezing, rales,rhonchi or crepitation. No use of accessory muscles of respiration.  CARDIOVASCULAR: S1, S2 normal. No murmurs, rubs, or gallops.  ABDOMEN: Soft, mild tender, nondistended. Bowel sounds slugish. No organomegaly or mass.  EXTREMITIES: No pedal edema, cyanosis, or clubbing.  NEUROLOGIC: pt is sleepy, but opens eyes to stimuli and speaks few words, not appear in distress.  PSYCHIATRIC: sleepy.  SKIN: No obvious rash, lesion, or ulcer.   Physical Exam LABORATORY PANEL:   CBC  Recent Labs Lab 03/04/16 1345  WBC 16.8*  HGB 11.6*  HCT 34.3*  PLT 426   ------------------------------------------------------------------------------------------------------------------  Chemistries   Recent Labs Lab 03/04/16 1345  NA 135  K 4.4  CL 100*  CO2 26  GLUCOSE 104*  BUN 47*  CREATININE 2.15*  CALCIUM 9.2  AST 94*  ALT 32  ALKPHOS 79  BILITOT 0.7    ------------------------------------------------------------------------------------------------------------------  Cardiac Enzymes  Recent Labs Lab 03/04/16 1345  TROPONINI 0.08*   ------------------------------------------------------------------------------------------------------------------  RADIOLOGY:  Ct Abdomen Pelvis Wo Contrast  Result Date: 03/04/2016 CLINICAL DATA:  Decreased PO intake past couple days with decreased activity. Increased confusion. EXAM: CT ABDOMEN AND PELVIS WITHOUT CONTRAST TECHNIQUE: Multidetector CT imaging of the abdomen and pelvis was performed following the standard protocol without IV contrast. COMPARISON:  CT 06/20/2015 and plain films 03/04/2016 FINDINGS: Lower chest: There is bibasilar opacification likely atelectasis although cannot exclude infection. Hepatobiliary: Possible gallbladder sludge. No evidence of liver mass or ductal dilatation. Pancreas: No mass or inflammatory process identified on this un-enhanced exam. Spleen: Within normal limits in size. Adrenals/Urinary Tract: Adrenal glands are within normal. Kidneys are normal in size without hydronephrosis or nephrolithiasis. There are 2 small right renal cyst. Ureters are within normal. There is a moderate amount of air within the bladder as well as mild air within the bladder wall. Findings may be due to recent instrumentation versus enterovesical fistula or infection. Bladder diverticula adjacent the anterior dome of the bladder containing air-fluid level. Second diverticula along the right posterior lateral aspect of the bladder containing mild hyperdense dependent debris. Stomach/Bowel: There is a moderate amount of free peritoneal air present. Stomach appears within normal. There are a few air and fluid-filled mildly dilated small bowel loops in the left upper quadrant. Right colon is somewhat difficult to define. Terminal ileum appear to be within normal. Appendix is not visualized. Air and stool  present throughout the colon. There are several mottled areas of lucency  likely air in stool over the region of the left pericolic gutter and pelvis likely extraluminal. There is a 2.4 cm air/stool collection abutting the posterior wall of the bladder and adjacent rectosigmoid colon which may be extraluminal versus prominent diverticula. Possible focal wall thickening/ mass along the wall of the rectosigmoid junction in the midline pelvis. Origin of the free peritoneal air is not clear although likely from rectosigmoid colon. Vascular/Lymphatic: No pathologically enlarged lymph nodes. Moderate calcified plaque over the abdominal aorta. There is moderate dilatation of the infrarenal abdominal aorta measuring 4.5 cm in AP diameter (previously 4.4 cm). Reproductive: Prostate is within normal. Other: None. Musculoskeletal: There are moderate degenerate changes of the spine with multilevel disc disease over the lumbar spine. Degenerative change of the hips. IMPRESSION: Moderate pneumoperitoneum. Origin of this free air is not clear, although likely involves the region of the rectosigmoid colon as there is focal wall thickening/ mass along the wall of the rectosigmoid colon as well as possible extraluminal stool adjacent the sigmoid colon. No significant adjacent adenopathy. Mild dilatation of a few small bowel loops in the left upper quadrant. Air within the bladder and bladder wall with 2 bladder diverticula identified. Findings may be due to recent instrumentation versus enterovesical fistula or infection. Mild bibasilar opacification likely atelectasis although cannot exclude pneumonia. 4.5 cm infrarenal abdominal aortic aneurysm (previously 4.4 cm). Recommend followup by ultrasound in 1 year. This recommendation follows ACR consensus guidelines: White Paper of the ACR Incidental Findings Committee II on Vascular Findings. J Am Coll Radiol 2013; 10:789-794. Right renal cyst. Possible gallbladder sludge. Critical  Value/emergent results were called by telephone at the time of interpretation on 03/04/2016 at 4:48 pm to Dr. York Cerise , who verbally acknowledged these results. Electronically Signed   By: Elberta Fortis M.D.   On: 03/04/2016 16:48   Dg Abdomen 1 View  Result Date: 03/04/2016 CLINICAL DATA:  Anorexia for 2 days. Altered mental status. History of abdominal aortic aneurysm and diverticulosis. EXAM: PORTABLE CHEST 1 VIEW COMPARISON:  CT abdomen and pelvis June 20, 2015 FINDINGS: Cardiomediastinal silhouette is normal. Mild calcific atherosclerosis of the aortic arch. Mild bibasilar strandy densities. Lungs are otherwise clear, no pleural effusions. No pneumothorax. Air density below the bilateral diaphragms. Bowel gas pattern is nondilated and nonobstructive. No intra-abdominal mass effect, pathologic calcifications or free air. Soft tissue planes and included osseous structures are non-suspicious. Moderate calcifications better characterized on prior CT. IMPRESSION: Pneumoperitoneum. RIGHT lung base atelectasis. Moderate atherosclerosis. Acute findings discussed with and reconfirmed by Festus Aloe on 03/04/2016 at 2:24 pm. Electronically Signed   By: Awilda Metro M.D.   On: 03/04/2016 14:24   Dg Chest Portable 1 View  Result Date: 03/04/2016 CLINICAL DATA:  Central line placement. EXAM: PORTABLE CHEST 1 VIEW COMPARISON:  Radiograph of March 04, 2016. FINDINGS: Stable cardiomediastinal silhouette. No pneumothorax is noted. Mild bibasilar subsegmental atelectasis is noted. Interval placement of left internal jugular catheter with distal tip in expected position of SVC. Degenerative changes seen involving the left glenohumeral joint. IMPRESSION: Interval placement of left internal jugular catheter with distal tip in expected position of the SVC. No pneumothorax is noted. Mild bibasilar subsegmental atelectasis is noted. Electronically Signed   By: Lupita Raider, M.D.   On: 03/04/2016 15:52   Dg  Chest Portable 1 View  Result Date: 03/04/2016 CLINICAL DATA:  Anorexia for 2 days. Altered mental status. History of abdominal aortic aneurysm and diverticulosis. EXAM: PORTABLE CHEST 1 VIEW COMPARISON:  CT abdomen and pelvis  June 20, 2015 FINDINGS: Cardiomediastinal silhouette is normal. Mild calcific atherosclerosis of the aortic arch. Mild bibasilar strandy densities. Lungs are otherwise clear, no pleural effusions. No pneumothorax. Air density below the bilateral diaphragms. Bowel gas pattern is nondilated and nonobstructive. No intra-abdominal mass effect, pathologic calcifications or free air. Soft tissue planes and included osseous structures are non-suspicious. Moderate calcifications better characterized on prior CT. IMPRESSION: Pneumoperitoneum. RIGHT lung base atelectasis. Moderate atherosclerosis. Acute findings discussed with and reconfirmed by Festus Aloer.[place] VERONESE on 03/04/2016 at 2:24 pm. Electronically Signed   By: Awilda Metroourtnay  Bloomer M.D.   On: 03/04/2016 14:24    ASSESSMENT AND PLAN:   Active Problems:   Septic shock (HCC)   DNR (do not resuscitate)   Palliative care by specialist   Generalized abdominal pain   Pneumoperitoneum  * Sepsis Due to perforated bowel    Started on comfort measures, Palliative care to help hospice placement.   Will give comfort food.   All the records are reviewed and case discussed with Care Management/Social Workerr. Management plans discussed with the patient, family and they are in agreement.  CODE STATUS: DNR  TOTAL TIME TAKING CARE OF THIS PATIENT: 25 minutes.  Pt's family were in room, I discussed the plan and answered the questions.   POSSIBLE D/C IN 1-2 DAYS, DEPENDING ON CLINICAL CONDITION.   Altamese DillingVACHHANI, Iker Nuttall M.D on 03/06/2016   Between 7am to 6pm - Pager - [phone]  After 6pm go to [email or web address] - [email or web address]  [facility]  Office  [phone]  CC: Primary care physician; Pcp Not In  System  Note: This dictation was prepared with Dragon dictation along with smaller phrase technology. Any transcriptional errors that result from this process are unintentional.

## 2016-03-06 NOTE — Progress Notes (Signed)
Pt transferred via stretcher by EMS per orders to hospice home, family present support given

## 2016-03-06 NOTE — Discharge Summary (Signed)
[facility] - [place] at [facility]   PATIENT NAME: Travis Jordan    MR#:  [id number]  DATE OF BIRTH:  1925/12/26  DATE OF ADMISSION:  03/04/2016 ADMITTING PHYSICIAN: Enedina Finner, MD  DATE OF DISCHARGE: 03/06/2016  PRIMARY CARE PHYSICIAN: Pcp Not In System    ADMISSION DIAGNOSIS:  Pneumoperitoneum [K66.8] UTI (lower urinary tract infection) [N39.0] Admission for palliative care [Z51.5] DNR (do not resuscitate) [Z66] Septic shock (HCC) [A41.9, R65.21]  DISCHARGE DIAGNOSIS:  Active Problems:   Septic shock (HCC)   DNR (do not resuscitate)   Palliative care by specialist   Generalized abdominal pain   Pneumoperitoneum   SECONDARY DIAGNOSIS:   Past Medical History:  Diagnosis Date  . AAA (abdominal aortic aneurysm) [facility])     HOSPITAL COURSE:   For perforated Bowel and sepsis- he was admitted with comfort care, stayed stable and comfortable. Seen by Palliative care, and arranged for hospice home.  DISCHARGE CONDITIONS:   Fair.  CONSULTS OBTAINED:    DRUG ALLERGIES:  No Known Allergies  DISCHARGE MEDICATIONS:   Current Discharge Medication List    START taking these medications   Details  morphine 20 MG/5ML solution Take 1.3 mLs (5.2 mg total) by mouth every 2 (two) hours as needed for pain (signs of anxiety). Qty: 100 mL, Refills: 0         DISCHARGE INSTRUCTIONS:    Comfort measures.  If you experience worsening of your admission symptoms, develop shortness of breath, life threatening emergency, suicidal or homicidal thoughts you must seek medical attention immediately by calling 911 or calling your MD immediately  if symptoms less severe.  You Must read complete instructions/literature along with all the possible adverse reactions/side effects for all the Medicines you take and that have been prescribed to you. Take any new Medicines after you have completely understood and accept all the possible adverse reactions/side  effects.   Please note  You were cared for by a hospitalist during your hospital stay. If you have any questions about your discharge medications or the care you received while you were in the hospital after you are discharged, you can call the unit and asked to speak with the hospitalist on call if the hospitalist that took care of you is not available. Once you are discharged, your primary care physician will handle any further medical issues. Please note that NO REFILLS for any discharge medications will be authorized once you are discharged, as it is imperative that you return to your primary care physician (or establish a relationship with a primary care physician if you do not have one) for your aftercare needs so that they can reassess your need for medications and monitor your lab values.    Today   CHIEF COMPLAINT:   Chief Complaint  Patient presents with  . Failure To Thrive    HISTORY OF PRESENT ILLNESS:  Travis Jordan  is a 82 y.o. male with a known history of AAA, dementia comes to the emergency room brought in by Jordan. Patient history is obtained from daughter and wife. Patient is currently unresponsive and unable to give any history of review of system. Per daughter patient has been declining over the past year with severe protein calorie malnutrition and failure to thrive. For last couple days he's not been eating and sleeping most of the day and decrease in his usual activity of daily routine. Today he was found unresponsive brought to the emergency room. His vomiting septic shock and evaluation  noted patient has pneumoperitoneum secondary to possible  perforated bowel. He was seen by surgery and given his comorbidities and current medical condition patient is not a candidate for any surgical option at present this was presented to the family who decided and requested comfort measures. Internal medicine was consulted for  admitting patient for comfort measures    VITAL  SIGNS:  Blood pressure 97/64, pulse (!) 109, temperature 98.4 F (36.9 C), temperature source Oral, resp. rate 20, height 5\' 11"  (1.803 m), weight 49.9 kg (110 lb), SpO2 92 %.  I/O:   Intake/Output Summary (Last 24 hours) at 03/06/16 1043 Last data filed at 03/06/16 0537  Gross per 24 hour  Intake                0 ml  Output             1050 ml  Net            -1050 ml    PHYSICAL EXAMINATION:   GENERAL:  82 y.o.-year-old patient lying in the bed with no acute distress.  EYES: Pupils equal, round, reactive to light .  HEENT: Head atraumatic, normocephalic. Oropharynx and nasopharynx clear.  NECK:  Supple, no jugular venous distention. No thyroid enlargement, no tenderness.  LUNGS: Normal breath sounds bilaterally, no wheezing, rales,rhonchi or crepitation. No use of accessory muscles of respiration.  CARDIOVASCULAR: S1, S2 normal. No murmurs, rubs, or gallops.  ABDOMEN: Soft, mild tender, nondistended. Bowel sounds slugish. No organomegaly or mass.  EXTREMITIES: No pedal edema, cyanosis, or clubbing.  NEUROLOGIC: pt is sleepy, but opens eyes to stimuli and speaks few words, not appear in distress.  PSYCHIATRIC: sleepy.  SKIN: No obvious rash, lesion, or ulcer.   DATA REVIEW:   CBC  Recent Labs Lab 03/04/16 1345  WBC 16.8*  HGB 11.6*  HCT 34.3*  PLT 426    Chemistries   Recent Labs Lab 03/04/16 1345  NA 135  K 4.4  CL 100*  CO2 26  GLUCOSE 104*  BUN 47*  CREATININE 2.15*  CALCIUM 9.2  AST 94*  ALT 32  ALKPHOS 79  BILITOT 0.7    Cardiac Enzymes  Recent Labs Lab 03/04/16 1345  TROPONINI 0.08*    Microbiology Results  Results for orders placed or performed during the hospital encounter of 03/04/16  Urine culture     Status: Abnormal   Collection Time: 03/04/16  1:54 PM  Result Value Ref Range Status   Specimen Description URINE, RANDOM  Final   Special Requests NONE  Final   Culture MULTIPLE SPECIES PRESENT, SUGGEST RECOLLECTION (A)  Final    Report Status 03/05/2016 FINAL  Final  Blood culture (routine x 2)     Status: None (Preliminary result)   Collection Time: 03/04/16  1:54 PM  Result Value Ref Range Status   Specimen Description BLOOD RIGHT AC  Final   Special Requests   Final    BOTTLES DRAWN AEROBIC AND ANAEROBIC AER 7CC,ANA 5CC   Culture NO GROWTH 2 DAYS  Final   Report Status PENDING  Incomplete  Blood culture (routine x 2)     Status: None (Preliminary result)   Collection Time: 03/04/16  1:54 PM  Result Value Ref Range Status   Specimen Description BLOOD RIGHT FOREARM  Final   Special Requests   Final    BOTTLES DRAWN AEROBIC AND ANAEROBIC AER 7CC,ANA 7CC   Culture NO GROWTH 2 DAYS  Final   Report Status PENDING  Incomplete  C difficile quick scan w PCR reflex     Status: None   Collection Time: 03/05/16  2:30 AM  Result Value Ref Range Status   C Diff antigen NEGATIVE NEGATIVE Final   C Diff toxin NEGATIVE NEGATIVE Final   C Diff interpretation Negative for C. difficile  Final    RADIOLOGY:  Ct Abdomen Pelvis Wo Contrast  Result Date: 03/04/2016 CLINICAL DATA:  Decreased PO intake past couple days with decreased activity. Increased confusion. EXAM: CT ABDOMEN AND PELVIS WITHOUT CONTRAST TECHNIQUE: Multidetector CT imaging of the abdomen and pelvis was performed following the standard protocol without IV contrast. COMPARISON:  CT 06/20/2015 and plain films 03/04/2016 FINDINGS: Lower chest: There is bibasilar opacification likely atelectasis although cannot exclude infection. Hepatobiliary: Possible gallbladder sludge. No evidence of liver mass or ductal dilatation. Pancreas: No mass or inflammatory process identified on this un-enhanced exam. Spleen: Within normal limits in size. Adrenals/Urinary Tract: Adrenal glands are within normal. Kidneys are normal in size without hydronephrosis or nephrolithiasis. There are 2 small right renal cyst. Ureters are within normal. There is a moderate amount of air within the  bladder as well as mild air within the bladder wall. Findings may be due to recent instrumentation versus enterovesical fistula or infection. Bladder diverticula adjacent the anterior dome of the bladder containing air-fluid level. Second diverticula along the right posterior lateral aspect of the bladder containing mild hyperdense dependent debris. Stomach/Bowel: There is a moderate amount of free peritoneal air present. Stomach appears within normal. There are a few air and fluid-filled mildly dilated small bowel loops in the left upper quadrant. Right colon is somewhat difficult to define. Terminal ileum appear to be within normal. Appendix is not visualized. Air and stool present throughout the colon. There are several mottled areas of lucency likely air in stool over the region of the left pericolic gutter and pelvis likely extraluminal. There is a 2.4 cm air/stool collection abutting the posterior wall of the bladder and adjacent rectosigmoid colon which may be extraluminal versus prominent diverticula. Possible focal wall thickening/ mass along the wall of the rectosigmoid junction in the midline pelvis. Origin of the free peritoneal air is not clear although likely from rectosigmoid colon. Vascular/Lymphatic: No pathologically enlarged lymph nodes. Moderate calcified plaque over the abdominal aorta. There is moderate dilatation of the infrarenal abdominal aorta measuring 4.5 cm in AP diameter (previously 4.4 cm). Reproductive: Prostate is within normal. Other: None. Musculoskeletal: There are moderate degenerate changes of the spine with multilevel disc disease over the lumbar spine. Degenerative change of the hips. IMPRESSION: Moderate pneumoperitoneum. Origin of this free air is not clear, although likely involves the region of the rectosigmoid colon as there is focal wall thickening/ mass along the wall of the rectosigmoid colon as well as possible extraluminal stool adjacent the sigmoid colon. No  significant adjacent adenopathy. Mild dilatation of a few small bowel loops in the left upper quadrant. Air within the bladder and bladder wall with 2 bladder diverticula identified. Findings may be due to recent instrumentation versus enterovesical fistula or infection. Mild bibasilar opacification likely atelectasis although cannot exclude pneumonia. 4.5 cm infrarenal abdominal aortic aneurysm (previously 4.4 cm). Recommend followup by ultrasound in 1 year. This recommendation follows ACR consensus guidelines: White Paper of the ACR Incidental Findings Committee II on Vascular Findings. J Am Coll Radiol 2013; 10:789-794. Right renal cyst. Possible gallbladder sludge. Critical Value/emergent results were called by telephone at the time of interpretation on 03/04/2016 at 4:48 pm to Dr. York CeriseForbach ,  who verbally acknowledged these results. Electronically Signed   By: Elberta Fortis M.D.   On: 03/04/2016 16:48   Dg Abdomen 1 View  Result Date: 03/04/2016 CLINICAL DATA:  Anorexia for 2 days. Altered mental status. History of abdominal aortic aneurysm and diverticulosis. EXAM: PORTABLE CHEST 1 VIEW COMPARISON:  CT abdomen and pelvis June 20, 2015 FINDINGS: Cardiomediastinal silhouette is normal. Mild calcific atherosclerosis of the aortic arch. Mild bibasilar strandy densities. Lungs are otherwise clear, no pleural effusions. No pneumothorax. Air density below the bilateral diaphragms. Bowel gas pattern is nondilated and nonobstructive. No intra-abdominal mass effect, pathologic calcifications or free air. Soft tissue planes and included osseous structures are non-suspicious. Moderate calcifications better characterized on prior CT. IMPRESSION: Pneumoperitoneum. RIGHT lung base atelectasis. Moderate atherosclerosis. Acute findings discussed with and reconfirmed by Festus Aloe on 03/04/2016 at 2:24 pm. Electronically Signed   By: Awilda Metro M.D.   On: 03/04/2016 14:24   Dg Chest Portable 1  View  Result Date: 03/04/2016 CLINICAL DATA:  Central line placement. EXAM: PORTABLE CHEST 1 VIEW COMPARISON:  Radiograph of March 04, 2016. FINDINGS: Stable cardiomediastinal silhouette. No pneumothorax is noted. Mild bibasilar subsegmental atelectasis is noted. Interval placement of left internal jugular catheter with distal tip in expected position of SVC. Degenerative changes seen involving the left glenohumeral joint. IMPRESSION: Interval placement of left internal jugular catheter with distal tip in expected position of the SVC. No pneumothorax is noted. Mild bibasilar subsegmental atelectasis is noted. Electronically Signed   By: Lupita Raider, M.D.   On: 03/04/2016 15:52   Dg Chest Portable 1 View  Result Date: 03/04/2016 CLINICAL DATA:  Anorexia for 2 days. Altered mental status. History of abdominal aortic aneurysm and diverticulosis. EXAM: PORTABLE CHEST 1 VIEW COMPARISON:  CT abdomen and pelvis June 20, 2015 FINDINGS: Cardiomediastinal silhouette is normal. Mild calcific atherosclerosis of the aortic arch. Mild bibasilar strandy densities. Lungs are otherwise clear, no pleural effusions. No pneumothorax. Air density below the bilateral diaphragms. Bowel gas pattern is nondilated and nonobstructive. No intra-abdominal mass effect, pathologic calcifications or free air. Soft tissue planes and included osseous structures are non-suspicious. Moderate calcifications better characterized on prior CT. IMPRESSION: Pneumoperitoneum. RIGHT lung base atelectasis. Moderate atherosclerosis. Acute findings discussed with and reconfirmed by Festus Aloe on 03/04/2016 at 2:24 pm. Electronically Signed   By: Awilda Metro M.D.   On: 03/04/2016 14:24    EKG:   Orders placed or performed during the hospital encounter of 03/04/16  . ED EKG  . ED EKG      Management plans discussed with the patient, family and they are in agreement.  CODE STATUS:     Code Status Orders        Start      Ordered   03/04/16 2248  Do not attempt resuscitation (DNR)  Continuous    Question Answer Comment  In the event of cardiac or respiratory ARREST Do not call a "code blue"   In the event of cardiac or respiratory ARREST Do not perform Intubation, CPR, defibrillation or ACLS   In the event of cardiac or respiratory ARREST Use medication by any route, position, wound care, and other measures to relive pain and suffering. May use oxygen, suction and manual treatment of airway obstruction as needed for comfort.      03/04/16 2247    Code Status History    Date Active Date Inactive Code Status Order ID Comments User Context   This patient has a current code  status but no historical code status.    Advance Directive Documentation   Flowsheet Row Most Recent Value  Type of Advance Directive  Healthcare Power of Attorney  Pre-existing out of facility DNR order (yellow form or pink MOST form)  No data  "MOST" Form in Place?  No data      TOTAL TIME TAKING CARE OF THIS PATIENT: 35 minutes.    Altamese Dilling M.D on 03/06/2016 at 10:43 AM  Between 7am to 6pm - Pager - [phone]  After 6pm go to [email or web address] - password [email or web address]  [facility]  Office  [phone]  CC: Primary care physician; Pcp Not In System   Note: This dictation was prepared with Dragon dictation along with smaller phrase technology. Any transcriptional errors that result from this process are unintentional.

## 2016-03-06 NOTE — Progress Notes (Signed)
Follow up visit to new hospice home referral. Family at bedside. Advised of bed availability, they remain agreeable to transport. Patient appeared cachetic, skin dry, clammy. Foley draining light amber urine. Respiratory rate 26.  Of note patient has required 6 doses of IV morphine at 1 mg each in the last 24 hours for treatment of pain and 1 dose of IV lorazepam at 0.5 mg for anxiety/restlessness. Report called to the hospice home, EMS notified for transport. Family and hospital care team all aware. Signed portable DNR in place in discharge packet. Thank you. Dayna BarkerKaren Robertson RN, BSN, [facility] [phone]

## 2016-03-09 LAB — CULTURE, BLOOD (ROUTINE X 2)
Culture: NO GROWTH
Culture: NO GROWTH

## 2016-03-13 DEATH — deceased

## 2017-01-13 IMAGING — DX DG CHEST 1V PORT
1 series · 1 of 1 positions shown · non-contrast
Comparison: CT abdomen and pelvis June 20, 2015

CLINICAL DATA: Anorexia for 2 days. Altered mental status. History
of abdominal aortic aneurysm and diverticulosis.

EXAM:
PORTABLE CHEST 1 VIEW

[chest ap]
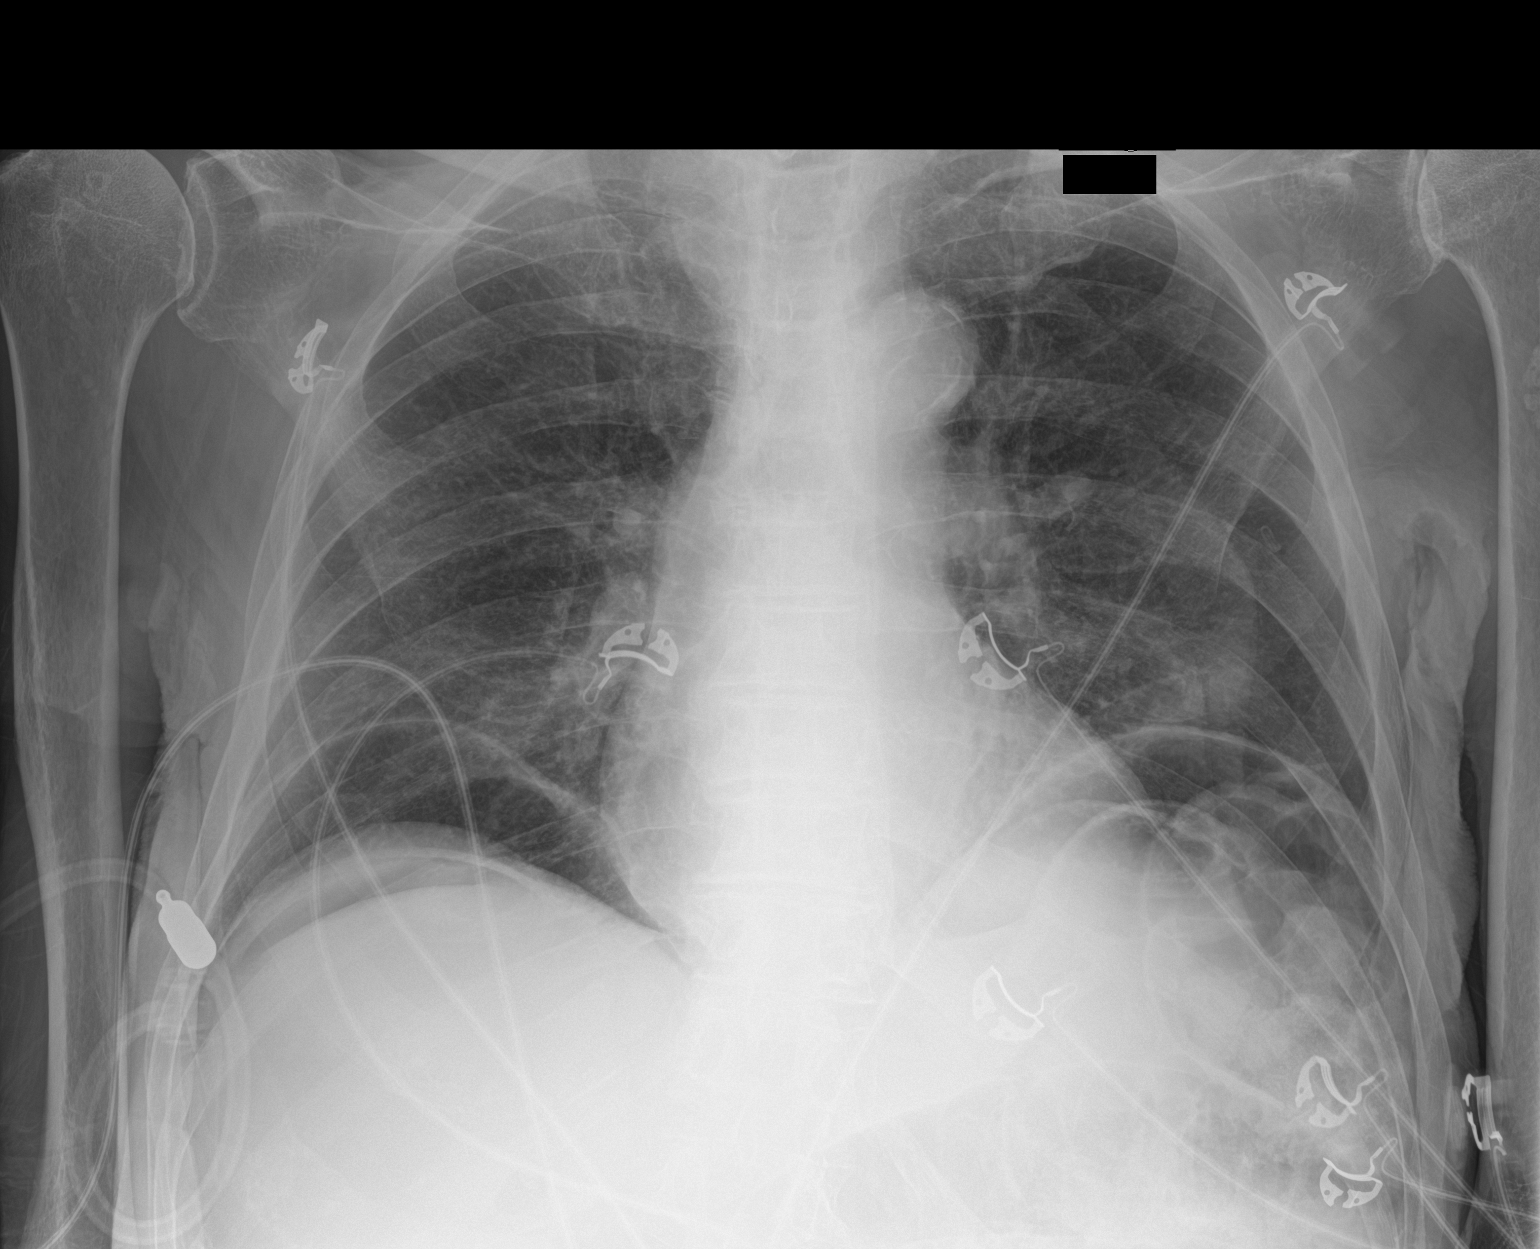

[1 of 1 positions shown; findings below may reference images not displayed]

FINDINGS: Cardiomediastinal silhouette is normal. Mild calcific
atherosclerosis of the aortic arch. Mild bibasilar strandy
densities. Lungs are otherwise clear, no pleural effusions. No
pneumothorax. Air density below the bilateral diaphragms.

Bowel gas pattern is nondilated and nonobstructive. No
intra-abdominal mass effect, pathologic calcifications or free air.
Soft tissue planes and included osseous structures are
non-suspicious. Moderate calcifications better characterized on
prior CT.
IMPRESSION: Pneumoperitoneum.

RIGHT lung base atelectasis.

Moderate atherosclerosis.

Acute findings discussed with and reconfirmed by Dr.[HOSPITAL]
CHO CON on 03/04/2016 at [DATE].
